# Patient Record
Sex: Male | Born: 2005 | Race: White | Hispanic: No | Marital: Single | Smoking: Never smoker
Health system: Southern US, Community
[De-identification: ages and names within clinical notes are randomized; demographics above are authoritative.]

---
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
## 2023-03-09 ENCOUNTER — Encounter: Payer: Self-pay | Admitting: *Deleted

## 2024-03-15 ENCOUNTER — Encounter: Payer: Self-pay | Admitting: *Deleted
